# Patient Record
Sex: Male | Born: 1990 | Race: Black or African American | Hispanic: Yes | Marital: Single | Smoking: Current some day smoker
Health system: Southern US, Community
[De-identification: ages and names within clinical notes are randomized; demographics above are authoritative.]

## PROBLEM LIST (undated history)

## (undated) HISTORY — PX: HAND SURGERY: SHX662

---
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
## 2010-11-12 ENCOUNTER — Emergency Department (HOSPITAL_COMMUNITY)
Admission: EM | Admit: 2010-11-12 | Discharge: 2010-11-12 | Disposition: A | Discharge status: Home / Self Care | Payer: 59 | Attending: Emergency Medicine | Admitting: Emergency Medicine

## 2010-11-12 ENCOUNTER — Emergency Department (HOSPITAL_COMMUNITY): Payer: 59

## 2010-11-12 DIAGNOSIS — M25579 Pain in unspecified ankle and joints of unspecified foot: Secondary | ICD-10-CM | POA: Insufficient documentation

## 2010-11-12 DIAGNOSIS — W219XXA Striking against or struck by unspecified sports equipment, initial encounter: Secondary | ICD-10-CM | POA: Insufficient documentation

## 2010-11-12 DIAGNOSIS — S93609A Unspecified sprain of unspecified foot, initial encounter: Secondary | ICD-10-CM | POA: Insufficient documentation

## 2010-11-12 DIAGNOSIS — S93409A Sprain of unspecified ligament of unspecified ankle, initial encounter: Secondary | ICD-10-CM | POA: Insufficient documentation

## 2010-11-12 DIAGNOSIS — Y9366 Activity, soccer: Secondary | ICD-10-CM | POA: Insufficient documentation

## 2011-12-19 ENCOUNTER — Encounter (HOSPITAL_COMMUNITY): Payer: Self-pay | Admitting: Emergency Medicine

## 2011-12-19 ENCOUNTER — Emergency Department (HOSPITAL_COMMUNITY)
Admission: EM | Admit: 2011-12-19 | Discharge: 2011-12-19 | Disposition: A | Discharge status: Home / Self Care | Payer: Managed Care, Other (non HMO) | Attending: Emergency Medicine | Admitting: Emergency Medicine

## 2011-12-19 DIAGNOSIS — W219XXA Striking against or struck by unspecified sports equipment, initial encounter: Secondary | ICD-10-CM | POA: Insufficient documentation

## 2011-12-19 DIAGNOSIS — M79609 Pain in unspecified limb: Secondary | ICD-10-CM

## 2011-12-19 DIAGNOSIS — S8010XA Contusion of unspecified lower leg, initial encounter: Secondary | ICD-10-CM | POA: Insufficient documentation

## 2011-12-19 DIAGNOSIS — Y9239 Other specified sports and athletic area as the place of occurrence of the external cause: Secondary | ICD-10-CM | POA: Insufficient documentation

## 2011-12-19 DIAGNOSIS — Y9366 Activity, soccer: Secondary | ICD-10-CM | POA: Insufficient documentation

## 2011-12-19 DIAGNOSIS — R209 Unspecified disturbances of skin sensation: Secondary | ICD-10-CM | POA: Insufficient documentation

## 2011-12-19 DIAGNOSIS — M7989 Other specified soft tissue disorders: Secondary | ICD-10-CM

## 2011-12-19 MED ORDER — TRAMADOL HCL 50 MG PO TABS: 50.0000 mg | ORAL_TABLET | Freq: Four times a day (QID) | ORAL | Status: AC | PRN

## 2011-12-19 NOTE — ED Provider Notes (Signed)
History     CSN: [id number]  Arrival date & time 12/19/11  1341   First MD Initiated Contact with Patient 12/19/11 1502     3:38 PM HPI Patient reports he is here for left calf pain. States he was playing soccer when he was kicked in his calf and has had progressively worsening hematoma and pain of his posterior calf. States pain radiates down to her foot. States he will also sometimes cause numbness in the foot. Denies chest pain, shortness of breath, deformity. Reports able to walk but is painful.  Patient is a 21 y.o. male presenting with leg pain. The history is provided by the patient.  Leg Pain  Incident location: kicked while playing soccer. The injury mechanism was a direct blow (kicked). Pain location: left calf. The pain is moderate. The pain has been constant since onset. Associated symptoms include numbness and tingling. Pertinent negatives include no inability to bear weight, no loss of motion, no muscle weakness and no loss of sensation. He reports no foreign bodies present. Exacerbated by: walking. He has tried NSAIDs for the symptoms. The treatment provided mild relief.    History reviewed. No pertinent past medical history.  History reviewed. No pertinent past surgical history.  No family history on file.  History  Substance Use Topics  . Smoking status: Current Some Day Smoker  . Smokeless tobacco: Not on file  . Alcohol Use: No      Review of Systems  Constitutional: Negative for fever, chills and diaphoresis.  Respiratory: Negative for shortness of breath.   Cardiovascular: Negative for chest pain.  Musculoskeletal: Negative for back pain.       Calf pain and contusion  Neurological: Positive for tingling and numbness. Negative for dizziness and weakness.  All other systems reviewed and are negative.    Allergies  Mushroom extract complex  Home Medications   Current Outpatient Rx  Name Route Sig Dispense Refill  . IBUPROFEN 200 MG PO TABS Oral Take  200 mg by mouth every 6 (six) hours as needed. Pain      BP 109/60  Pulse 67  Temp(Src) 98.8 F (37.1 C) (Oral)  Resp 20  SpO2 100%  Physical Exam  Constitutional: He appears well-developed and well-nourished.  HENT:  Head: Normocephalic and atraumatic.  Eyes: Pupils are equal, round, and reactive to light.  Cardiovascular: Normal rate and regular rhythm.   Pulmonary/Chest: Effort normal and breath sounds normal.  Musculoskeletal:       Left lower leg: He exhibits tenderness and swelling. He exhibits no bony tenderness, no deformity and no laceration.       Legs: Skin: Skin is warm and dry. No rash noted. No erythema. No pallor.  Psychiatric: He has a normal mood and affect. His behavior is normal.    ED Course  Procedures   MDM   Discussed and we will order ultrasound to rule out DVT for patient. The suspicion. Suspect patient has a contusion.   4:24 PM Venous Dopplers were negative for DVT. Will discharge home with Ultram and continue to use ibuprofen for pain if needed. Patient agrees with plan and is ready for discharge.     Thomasene Lot, PA-C 12/19/11 1624

## 2011-12-19 NOTE — Discharge Instructions (Signed)
Your ultrasound was normal for a deep venous thrombosis, also known as a blood clot. Likely you are experiencing pain from a large contusion. Please read instructions below regarding care for this.  Contusion A contusion is a deep bruise. Contusions are the result of an injury that caused bleeding under the skin. The contusion may turn blue, purple, or yellow. Minor injuries will give you a painless contusion, but more severe contusions may stay painful and swollen for a few weeks.  CAUSES  A contusion is usually caused by a blow, trauma, or direct force to an area of the body. SYMPTOMS   Swelling and redness of the injured area.   Bruising of the injured area.   Tenderness and soreness of the injured area.   Pain.  DIAGNOSIS  The diagnosis can be made by taking a history and physical exam. An X-ray, CT scan, or MRI may be needed to determine if there were any associated injuries, such as fractures. TREATMENT  Specific treatment will depend on what area of the body was injured. In general, the best treatment for a contusion is resting, icing, elevating, and applying cold compresses to the injured area. Over-the-counter medicines may also be recommended for pain control. Ask your caregiver what the best treatment is for your contusion. HOME CARE INSTRUCTIONS   Put ice on the injured area.   Put ice in a plastic bag.   Place a towel between your skin and the bag.   Leave the ice on for 15 to 20 minutes, 3 to 4 times a day.   Only take over-the-counter or prescription medicines for pain, discomfort, or fever as directed by your caregiver. Your caregiver may recommend avoiding anti-inflammatory medicines (aspirin, ibuprofen, and naproxen) for 48 hours because these medicines may increase bruising.   Rest the injured area.   If possible, elevate the injured area to reduce swelling.  SEEK IMMEDIATE MEDICAL CARE IF:   You have increased bruising or swelling.   You have pain that is  getting worse.   Your swelling or pain is not relieved with medicines.  MAKE SURE YOU:   Understand these instructions.   Will watch your condition.   Will get help right away if you are not doing well or get worse.  Document Released: 05/23/2005 Document Revised: 08/02/2011 Document Reviewed: 06/18/2011 [facility] Patient Information 2012 [place], [place].

## 2011-12-19 NOTE — ED Provider Notes (Signed)
Medical screening examination/treatment/procedure(s) were performed by non-physician practitioner and as supervising physician I was immediately available for consultation/collaboration.   Rolan Bucco, MD 12/19/11 1723

## 2011-12-19 NOTE — Progress Notes (Signed)
VASCULAR LAB PRELIMINARY  PRELIMINARY  PRELIMINARY  PRELIMINARY Left lower extremity venous duplex completed.    Preliminary report:  Left:  No evidence of DVT, superficial thrombosis, or Baker's cyst.  Annielee Jemmott D, 12/19/2011, 4:21 PM

## 2011-12-19 NOTE — ED Notes (Signed)
Pt presenting to ed with c/o left leg with pain s/p getting kicked in his left calf with hematoma that's increasing in size and worse with walking.

## 2012-02-24 IMAGING — CR DG ANKLE COMPLETE 3+V*L*
3 series · 3 of 3 positions shown · non-contrast
Comparison: None.

CLINICAL DATA: Twisted left ankle.  Left ankle pain.

LEFT ANKLE COMPLETE - 3+ VIEW

[t ankle joint ap left]
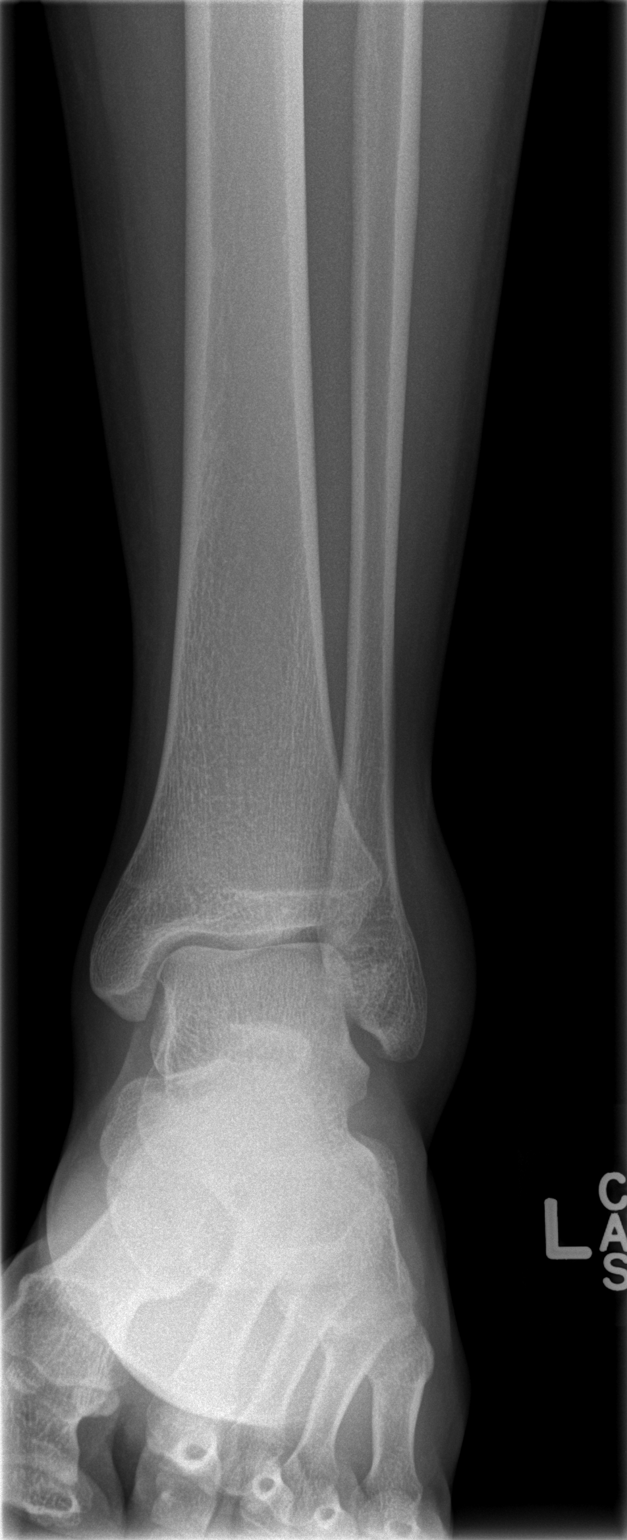

[t ankle joint oblique left]
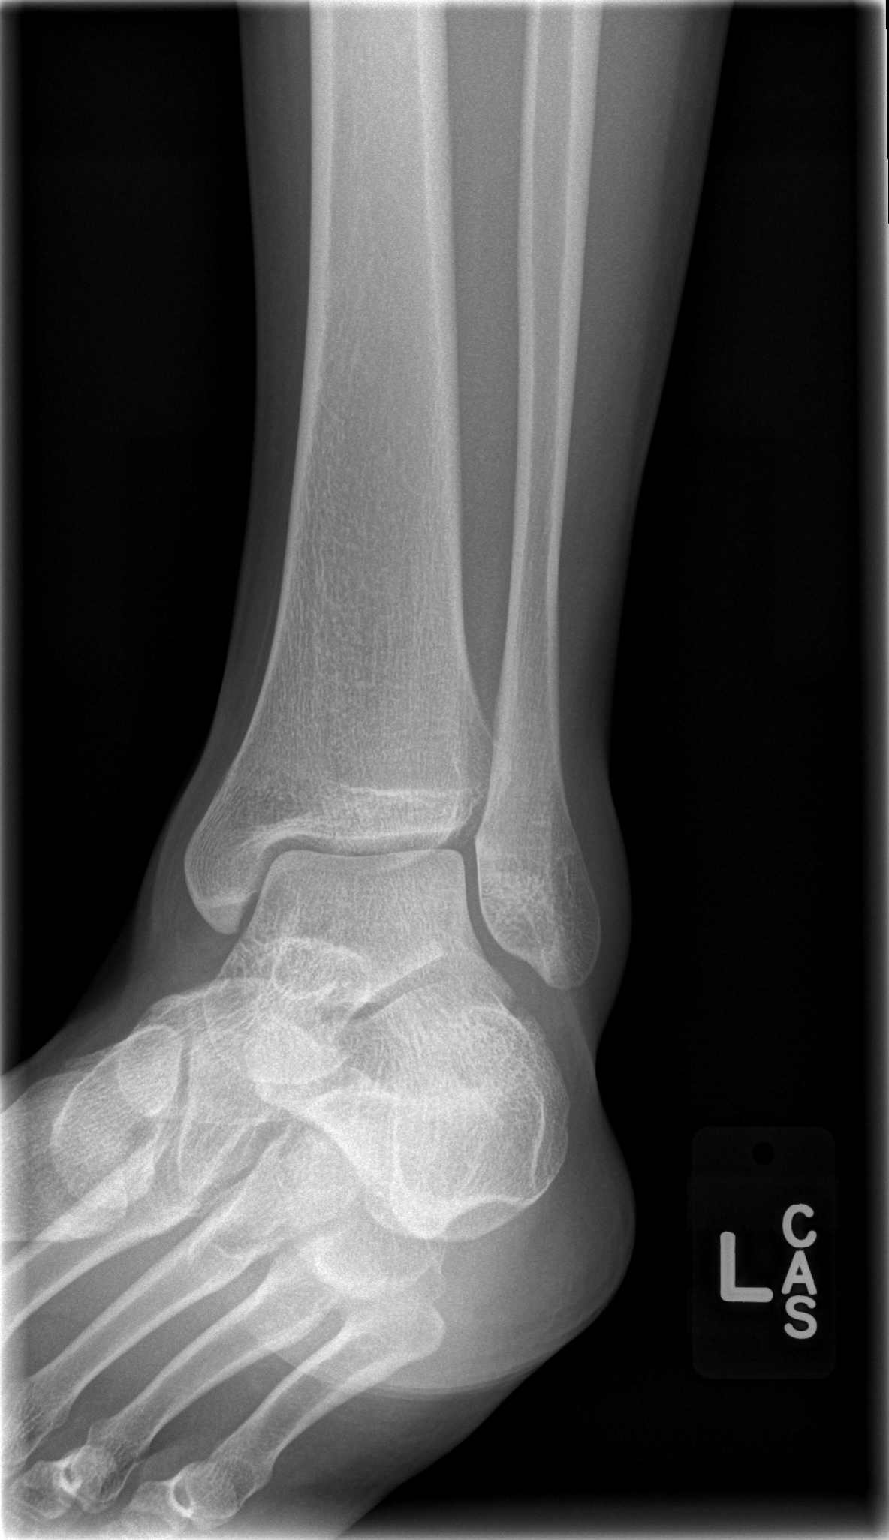

[t ankle joint lat left]
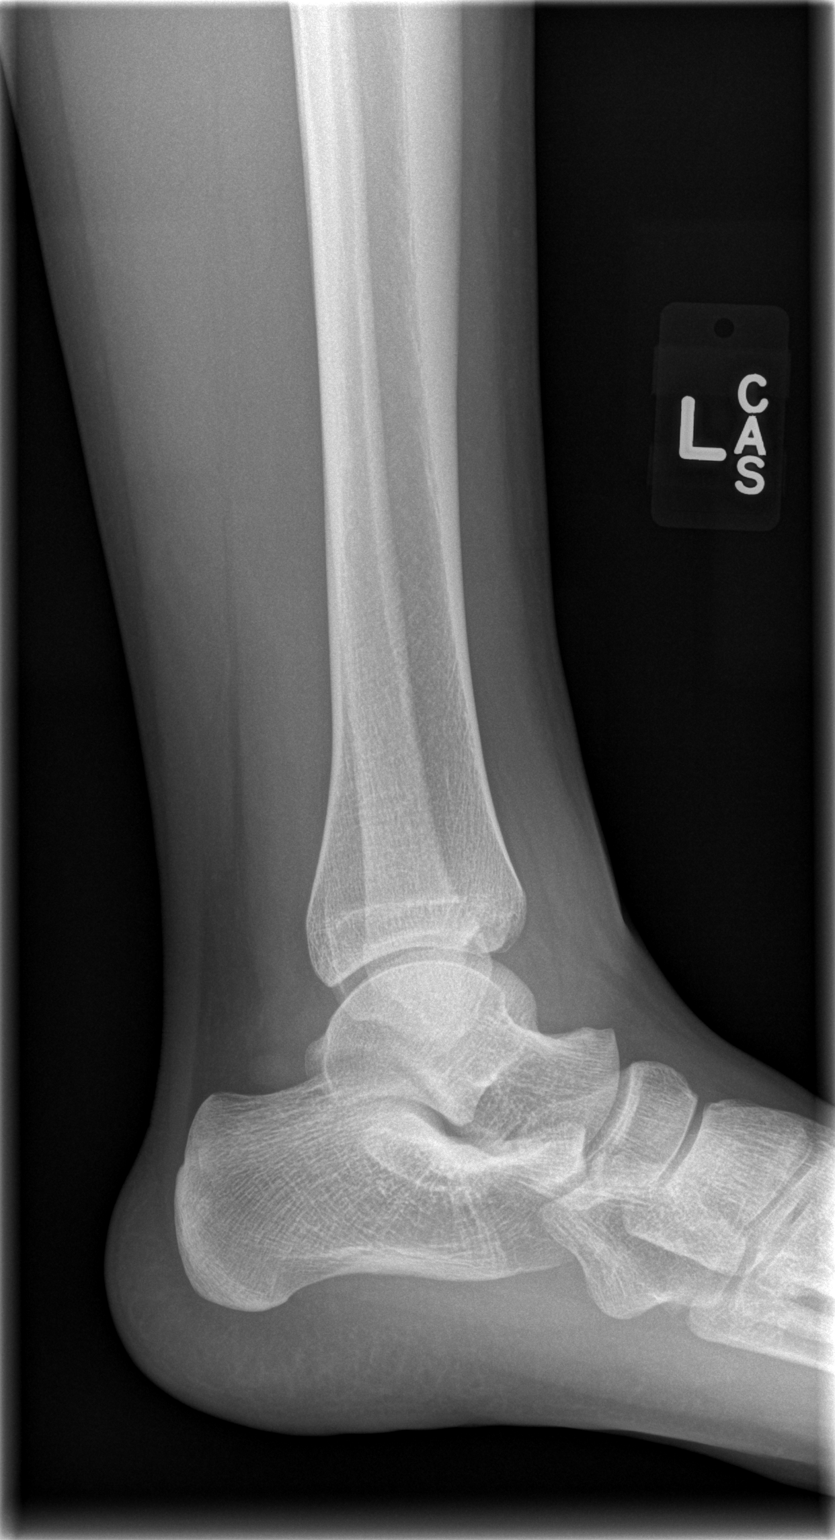

[3 of 3 positions shown; findings below may reference images not displayed]

FINDINGS: The ankle mortise is congruent.  The talar dome is
intact.  No ankle effusion is present.  Lateral malleolar soft
tissue swelling.
IMPRESSION: No acute osseous abnormality.

## 2012-04-14 ENCOUNTER — Encounter (HOSPITAL_COMMUNITY): Payer: Self-pay | Admitting: *Deleted

## 2012-04-14 ENCOUNTER — Emergency Department (HOSPITAL_COMMUNITY)
Admission: EM | Admit: 2012-04-14 | Discharge: 2012-04-14 | Disposition: A | Discharge status: Home / Self Care | Payer: Managed Care, Other (non HMO) | Attending: Emergency Medicine | Admitting: Emergency Medicine

## 2012-04-14 ENCOUNTER — Emergency Department (HOSPITAL_COMMUNITY): Payer: Managed Care, Other (non HMO)

## 2012-04-14 DIAGNOSIS — F17200 Nicotine dependence, unspecified, uncomplicated: Secondary | ICD-10-CM | POA: Insufficient documentation

## 2012-04-14 DIAGNOSIS — J209 Acute bronchitis, unspecified: Secondary | ICD-10-CM | POA: Insufficient documentation

## 2012-04-14 LAB — RAPID STREP SCREEN (MED CTR MEBANE ONLY): Streptococcus, Group A Screen (Direct): NEGATIVE

## 2012-04-14 MED ORDER — GUAIFENESIN 100 MG/5ML PO LIQD: 100.0000 mg | ORAL | Status: AC | PRN

## 2012-04-14 MED ORDER — ALBUTEROL SULFATE HFA 108 (90 BASE) MCG/ACT IN AERS: 1.0000 | INHALATION_SPRAY | Freq: Four times a day (QID) | RESPIRATORY_TRACT | Status: DC | PRN

## 2012-04-14 NOTE — ED Notes (Signed)
Pt c/o cough and sore throat x's 3 days.

## 2012-04-14 NOTE — ED Provider Notes (Signed)
History     CSN: [id number]  Arrival date & time 04/14/12  1531   None     Chief Complaint  Patient presents with  . Sore Throat  . Cough    (Consider location/radiation/quality/duration/timing/severity/associated sxs/prior treatment) HPI Comments: Patient reports a 4 day history of sore throat and cough. He reports gradual onset. He says the cough is unproductive and persistent and not better or worse at any point in the day. He has associated chest pain with coughing. He denies fever, sinus congestion, wheezing. He has tried tea with honey which has not helped. He denies sick contacts.   Patient is a 21 y.o. male presenting with pharyngitis and cough.  Sore Throat Associated symptoms include chest pain and coughing. Pertinent negatives include no chills, congestion, diaphoresis, fatigue, fever, headaches, myalgias, nausea, neck pain, rash, vomiting or weakness.  Cough Associated symptoms include chest pain. Pertinent negatives include no chills, no headaches, no myalgias, no shortness of breath and no wheezing.    History reviewed. No pertinent past medical history.  History reviewed. No pertinent past surgical history.  History reviewed. No pertinent family history.  History  Substance Use Topics  . Smoking status: Current Some Day Smoker    Types: Cigars  . Smokeless tobacco: Not on file  . Alcohol Use: No      Review of Systems  Constitutional: Negative for fever, chills, diaphoresis and fatigue.  HENT: Negative for congestion, neck pain and postnasal drip.   Respiratory: Positive for cough. Negative for shortness of breath and wheezing.   Cardiovascular: Positive for chest pain.  Gastrointestinal: Negative for nausea, vomiting and diarrhea.  Musculoskeletal: Negative for myalgias.  Skin: Negative for rash and wound.  Neurological: Negative for dizziness, weakness and headaches.    Allergies  Mushroom extract complex  Home Medications   Current Outpatient  Rx  Name Route Sig Dispense Refill  . IBUPROFEN 200 MG PO TABS Oral Take 200 mg by mouth every 6 (six) hours as needed. Pain.    Marland Kitchen OVER THE COUNTER MEDICATION Oral Take 1 tablet by mouth daily as needed. Cough.    Marland Kitchen OVER THE COUNTER MEDICATION Oral Take 15 mLs by mouth 2 (two) times daily as needed. "Cold and flu liquid."      BP 106/84  Pulse 82  Temp 98.9 F (37.2 C) (Oral)  Resp 18  Wt 132 lb 3.2 oz (59.966 kg)  SpO2 100%  Physical Exam  Nursing note and vitals reviewed. Constitutional: He is oriented to person, place, and time. He appears well-developed and well-nourished. No distress.  HENT:  Head: Normocephalic and atraumatic.  Mouth/Throat: No oropharyngeal exudate.       Pharyngeal erythema noted.   Eyes: Conjunctivae are normal. No scleral icterus.  Neck: Normal range of motion.  Cardiovascular: Normal rate and regular rhythm.  Exam reveals no gallop and no friction rub.   No murmur heard. Pulmonary/Chest: Effort normal and breath sounds normal.       Mild rales heard throughout lung fields.   Abdominal: Soft. There is no tenderness.  Musculoskeletal: Normal range of motion.  Neurological: He is alert and oriented to person, place, and time.  Skin: Skin is warm and dry. He is not diaphoretic.  Psychiatric: He has a normal mood and affect. His behavior is normal.    ED Course  Procedures (including critical care time)   Labs Reviewed  RAPID STREP SCREEN   Dg Chest 2 View  04/14/2012  *RADIOLOGY REPORT*  Clinical Data:  Sore throat  CHEST - 2 VIEW  Comparison: None.  Findings: Bronchitic changes.  Hyperaeration.  No consolidation. No pneumothorax.  No pleural effusion.  IMPRESSION: Bronchitic changes and hyperaeration.   Original Report Authenticated By: Donavan Burnet, M.D.      No diagnosis found.    MDM  6:00 PM Patient's xray shows bronchitic changes. He will be treated as acute bronchitis with albuterol inhaler and antitussives. He should return if  cough persists beyond 4 weeks. No further evaluation at this time.         Emilia Beck, PA-C 04/14/12 1801

## 2012-04-16 NOTE — ED Provider Notes (Signed)
Medical screening examination/treatment/procedure(s) were performed by non-physician practitioner and as supervising physician I was immediately available for consultation/collaboration.  Raeford Razor, MD 04/16/12 [phone]

## 2012-08-10 ENCOUNTER — Emergency Department (HOSPITAL_BASED_OUTPATIENT_CLINIC_OR_DEPARTMENT_OTHER)
Admission: EM | Admit: 2012-08-10 | Discharge: 2012-08-11 | Disposition: A | Discharge status: Home / Self Care | Payer: Managed Care, Other (non HMO) | Attending: Emergency Medicine | Admitting: Emergency Medicine

## 2012-08-10 ENCOUNTER — Emergency Department (HOSPITAL_BASED_OUTPATIENT_CLINIC_OR_DEPARTMENT_OTHER): Payer: Managed Care, Other (non HMO)

## 2012-08-10 ENCOUNTER — Encounter (HOSPITAL_BASED_OUTPATIENT_CLINIC_OR_DEPARTMENT_OTHER): Payer: Self-pay | Admitting: *Deleted

## 2012-08-10 DIAGNOSIS — F17200 Nicotine dependence, unspecified, uncomplicated: Secondary | ICD-10-CM | POA: Insufficient documentation

## 2012-08-10 DIAGNOSIS — Y939 Activity, unspecified: Secondary | ICD-10-CM | POA: Insufficient documentation

## 2012-08-10 DIAGNOSIS — W230XXA Caught, crushed, jammed, or pinched between moving objects, initial encounter: Secondary | ICD-10-CM | POA: Insufficient documentation

## 2012-08-10 DIAGNOSIS — Y92009 Unspecified place in unspecified non-institutional (private) residence as the place of occurrence of the external cause: Secondary | ICD-10-CM | POA: Insufficient documentation

## 2012-08-10 DIAGNOSIS — Z79899 Other long term (current) drug therapy: Secondary | ICD-10-CM | POA: Insufficient documentation

## 2012-08-10 DIAGNOSIS — S6000XA Contusion of unspecified finger without damage to nail, initial encounter: Secondary | ICD-10-CM | POA: Insufficient documentation

## 2012-08-10 DIAGNOSIS — S60111A Contusion of right thumb with damage to nail, initial encounter: Secondary | ICD-10-CM

## 2012-08-10 MED ORDER — IBUPROFEN 800 MG PO TABS
800.0000 mg | ORAL_TABLET | Freq: Once | ORAL | Status: AC
  Administered 2012-08-11: 800 mg via ORAL
  Filled 2012-08-10: qty 1

## 2012-08-10 NOTE — ED Notes (Signed)
Pt. States that he "smashed" his right thumb in his car door approx 3 weeks ago. C/o pain to left thumb and brusing noted under nailbed with swelling. No other complaints.

## 2012-08-10 NOTE — ED Provider Notes (Signed)
History  This chart was scribed for Ryan Mcclain Ryan Cords, MD by Shari Heritage, ED Scribe. The patient was seen in room MHT13/MHT13. Patient's care was started at 2325.  CSN: [id number]  Arrival date & time 08/10/12  1945   First MD Initiated Contact with Patient 08/10/12 2325      Chief Complaint  Patient presents with  . right thumb swelling      Patient is a 21 y.o. male presenting with hand injury. The history is provided by the patient. No language interpreter was used.  Hand Injury  The incident occurred more than 1 week ago. The incident occurred at home. The injury mechanism was a direct blow. The pain is present in the right hand. The pain is moderate. The pain has been constant since the incident. He reports no foreign bodies present. The symptoms are aggravated by palpation. He has tried ice for the symptoms.    HPI Comments: Ryan Mcclain is a 21 y.o. male who presents to the Emergency Department complaining of moderate to severe, constant, non-radiating, dull pain in the right thumb with associated swelling and bruising underneath the nail bed. Patient states that he slammed his right thumb in his car door three weeks ago. Patient says that he iced his thumb immediately after the injury and noted some bruising and minimal pain immediately after the incident. Patient states that three days ago, pain and swelling increased significantly. Patient hasn't been taking any NSAIDs or other pain medications for relief. Patient denies any other symptoms at this time. No other injuries. He reports no significant past medical or surgical history. He is a current some day smoker.    No family history on file.  History  Substance Use Topics  . Smoking status: Current Some Day Smoker    Types: Cigars  . Smokeless tobacco: Not on file  . Alcohol Use: No      Review of Systems  Musculoskeletal: Positive for arthralgias.       Positive for right hand pain (thumb).  All other systems  reviewed and are negative.    Allergies  Mushroom extract complex  Home Medications   Current Outpatient Rx  Name  Route  Sig  Dispense  Refill  . ALBUTEROL SULFATE HFA 108 (90 BASE) MCG/ACT IN AERS   Inhalation   Inhale 1-2 puffs into the lungs every 6 (six) hours as needed for wheezing.   1 Inhaler   0   . IBUPROFEN 200 MG PO TABS   Oral   Take 200 mg by mouth every 6 (six) hours as needed. Pain.         Marland Kitchen OVER THE COUNTER MEDICATION   Oral   Take 1 tablet by mouth daily as needed. Cough.         Marland Kitchen OVER THE COUNTER MEDICATION   Oral   Take 15 mLs by mouth 2 (two) times daily as needed. "Cold and flu liquid."           Triage Vitals: BP 114/68  Pulse 77  Temp 98.3 F (36.8 C)  Resp 18  Ht 5\' 10"  (1.778 m)  Wt 130 lb (58.968 kg)  BMI 18.65 kg/m2  SpO2 100%  Physical Exam  Nursing note and vitals reviewed. Constitutional: He is oriented to person, place, and time. He appears well-developed and well-nourished. No distress.  HENT:  Head: Normocephalic and atraumatic.  Mouth/Throat: Oropharynx is clear and moist and mucous membranes are normal.  Eyes: Conjunctivae normal and EOM are  normal. Pupils are equal, round, and reactive to light.  Neck: Normal range of motion. Neck supple.  Cardiovascular: Normal rate and regular rhythm.   No murmur heard. Pulmonary/Chest: Effort normal and breath sounds normal. No respiratory distress. He has no wheezes. He has no rales.  Abdominal: Soft. Bowel sounds are normal. He exhibits no distension. There is no tenderness.  Musculoskeletal: Normal range of motion. He exhibits no edema.       No bony deformity of right thumb. Full ROM. Neurovascularly intact. No snuff box. Subungual hematoma underneath nail of right thumb, about 1/4 of the nail in size. No signs of paronychia.   Neurological: He is alert and oriented to person, place, and time.  Skin: Skin is warm and dry.  Psychiatric: He has a normal mood and affect. His  behavior is normal.    ED Course  INCISION AND DRAINAGE Date/Time: 08/11/2012 12:49 AM Performed by: Jasmine Awe Authorized by: Jasmine Awe Consent: Verbal consent obtained. Patient identity confirmed: arm band Type: seroma (cuticle) Body area: upper extremity Location details: right thumb Patient sedated: no Scalpel size: 11 Incision type: single straight Complexity: simple Drainage: serosanguinous Drainage amount: scant Wound treatment: wound left open Patient tolerance: Patient tolerated the procedure well with no immediate complications.   patient had swelling along cuticle, lifted cuticl serosanguinous drainage expressed area deflated, patient feels improved  (including critical care time) DIAGNOSTIC STUDIES: Oxygen Saturation is 100% on room air, normal by my interpretation.    COORDINATION OF CARE: 11:54 PM- Patient informed of current plan for treatment and evaluation and agrees with plan at this time.      Dg Hand Complete Right  08/11/2012  *RADIOLOGY REPORT*  Clinical Data: Hand pain  RIGHT HAND - COMPLETE 3+ VIEW  Comparison: None.  Findings: No fracture or dislocation.  No aggressive osseous lesions.  IMPRESSION: No acute osseous abnormality of the right hand.   Original Report Authenticated By: Jearld Lesch, M.D.      No diagnosis found.    MDM  Subungual hematoma lower 1/4 of the nail has been there for three weeks so unable to trephinate the nail to relieve pain, white in the center. Will place on antibiotics. Follow up with hand surgeon this week for continuing care. Return for worsening pain, swelling, fever, numbness, weakness, diarrhea, vomiting or any concerns. Patient verbalizes understanding and agrees to follow up.     I personally performed the services described in this documentation, which was scribed in my presence. The recorded information has been reviewed and is accurate.     Jasmine Awe,  MD 08/11/12 [phone]

## 2012-08-11 ENCOUNTER — Encounter (HOSPITAL_COMMUNITY): Payer: Self-pay | Admitting: *Deleted

## 2012-08-11 ENCOUNTER — Emergency Department (HOSPITAL_COMMUNITY)
Admission: EM | Admit: 2012-08-11 | Discharge: 2012-08-11 | Disposition: A | Discharge status: Home / Self Care | Payer: Managed Care, Other (non HMO) | Attending: Emergency Medicine | Admitting: Emergency Medicine

## 2012-08-11 DIAGNOSIS — Y9389 Activity, other specified: Secondary | ICD-10-CM | POA: Insufficient documentation

## 2012-08-11 DIAGNOSIS — F17200 Nicotine dependence, unspecified, uncomplicated: Secondary | ICD-10-CM | POA: Insufficient documentation

## 2012-08-11 DIAGNOSIS — Y9210 Unspecified residential institution as the place of occurrence of the external cause: Secondary | ICD-10-CM | POA: Insufficient documentation

## 2012-08-11 DIAGNOSIS — W07XXXA Fall from chair, initial encounter: Secondary | ICD-10-CM | POA: Insufficient documentation

## 2012-08-11 DIAGNOSIS — R55 Syncope and collapse: Secondary | ICD-10-CM | POA: Insufficient documentation

## 2012-08-11 DIAGNOSIS — R9431 Abnormal electrocardiogram [ECG] [EKG]: Secondary | ICD-10-CM | POA: Insufficient documentation

## 2012-08-11 LAB — URINALYSIS, ROUTINE W REFLEX MICROSCOPIC
Bilirubin Urine: NEGATIVE
Glucose, UA: NEGATIVE mg/dL
Ketones, ur: NEGATIVE mg/dL
Nitrite: NEGATIVE
Protein, ur: NEGATIVE mg/dL
pH: 7.5 (ref 5.0–8.0)

## 2012-08-11 LAB — BASIC METABOLIC PANEL
BUN: 12 mg/dL (ref 6–23)
Calcium: 9.6 mg/dL (ref 8.4–10.5)
Chloride: 99 mEq/L (ref 96–112)
Creatinine, Ser: 0.79 mg/dL (ref 0.50–1.35)
GFR calc Af Amer: >90 mL/min (ref >90)

## 2012-08-11 LAB — CBC WITH DIFFERENTIAL/PLATELET
Basophils Relative: 0 % (ref 0–1)
Eosinophils Relative: 3 % (ref 0–5)
HCT: 49.3 % (ref 39.0–52.0)
Hemoglobin: 17.4 g/dL — ABNORMAL HIGH (ref 13.0–17.0)
MCH: 31.2 pg (ref 26.0–34.0)
MCHC: 35.3 g/dL (ref 30.0–36.0)
MCV: 88.4 fL (ref 78.0–100.0)
Monocytes Absolute: 0.6 10*3/uL (ref 0.1–1.0)
Monocytes Relative: 11 % (ref 3–12)
Neutro Abs: 2.9 10*3/uL (ref 1.7–7.7)
RDW: 12.5 % (ref 11.5–15.5)

## 2012-08-11 MED ORDER — SULFAMETHOXAZOLE-TRIMETHOPRIM 800-160 MG PO TABS: 1.0000 | ORAL_TABLET | Freq: Two times a day (BID) | ORAL | Status: DC

## 2012-08-11 MED ORDER — SODIUM CHLORIDE 0.9 % IV BOLUS (SEPSIS)
1000.0000 mL | Freq: Once | INTRAVENOUS | Status: AC
  Administered 2012-08-11: 1000 mL via INTRAVENOUS

## 2012-08-11 MED ORDER — IBUPROFEN 600 MG PO TABS: 600.0000 mg | ORAL_TABLET | Freq: Four times a day (QID) | ORAL | Status: DC | PRN

## 2012-08-11 NOTE — ED Provider Notes (Addendum)
History     CSN: [id number]  Arrival date & time 08/11/12  1814   First MD Initiated Contact with Patient 08/11/12 1818      Chief Complaint  Patient presents with  . Loss of Consciousness    (Consider location/radiation/quality/duration/timing/severity/associated sxs/prior treatment) HPI 21 y/o male presents from Hand surgery center with cc syncopal episode. Patient states that he had not eaten all day before the procedure.  He was given digital block and became light headed. He was allowed to lay down and given juice and crackers.  The patient was got through the procedure and was recovering in a chair when he lost consciousness and slumped to the floor.  Patient had 1 min LOC and apparently hit his head on the wall. Transported via EMS on spinal precautions.  Normal CBG.  Patient has history of previous syncopal episode while at home in his country of [place].  He denies been evaluated at the hospital for visit the episode at that time.  He states that he had also gone a long period of time without eating when this occurred.  denies racing or skipping heart denies recent history of nausea vomiting or fluid loss.   Denies fevers, chills, myalgias, arthralgias. Denies DOE, SOB, chest tightness or pressure, radiation to left arm, jaw or back, or diaphoresis. Denies dysuria, flank pain, suprapubic pain, frequency, urgency, or hematuria. Denies headaches, light headedness, weakness, visual disturbances. Denies abdominal pain, nausea, vomiting, diarrhea or constipation.    History reviewed. No pertinent past medical history.  Past Surgical History  Procedure Date  . Hand surgery     History reviewed. No pertinent family history.  History  Substance Use Topics  . Smoking status: Current Some Day Smoker    Types: Pipe  . Smokeless tobacco: Not on file  . Alcohol Use: 0.5 oz/week    1 drink(s) per week      Review of Systems Ten systems reviewed and are negative for  acute change, except as noted in the HPI. \ Allergies  Mushroom extract complex  Home Medications  No current outpatient prescriptions on file.  BP 123/66  Pulse 67  Temp 97.5 F (36.4 C) (Oral)  Resp 12  SpO2 98%  Physical Exam Physical Exam  Nursing note and vitals reviewed. Constitutional: He appears well-developed and well-nourished. No distress.  HENT:  Head: Normocephalic and atraumatic.  Eyes: Conjunctivae normal are normal. No scleral icterus.  Neck: Normal range of motion. Neck supple.  Cardiovascular: Normal rate, regular rhythm and normal heart sounds.   Pulmonary/Chest: Effort normal and breath sounds normal. No respiratory distress.  Abdominal: Soft. There is no tenderness.  Musculoskeletal: He exhibits no edema.  Neurological: He is alert.  Skin: Skin is warm and dry. He is not diaphoretic.  Psychiatric: His behavior is normal.    ED Course  Procedures (including critical care time)  Labs Reviewed  CBC WITH DIFFERENTIAL - Abnormal; Notable for the following:    Hemoglobin 17.4 (*)     All other components within normal limits  BASIC METABOLIC PANEL  URINALYSIS, ROUTINE W REFLEX MICROSCOPIC   Dg Hand Complete Right  08/11/2012  *RADIOLOGY REPORT*  Clinical Data: Hand pain  RIGHT HAND - COMPLETE 3+ VIEW  Comparison: None.  Findings: No fracture or dislocation.  No aggressive osseous lesions.  IMPRESSION: No acute osseous abnormality of the right hand.   Original Report Authenticated By: Jearld Lesch, M.D.      Date: 08/12/2012  Rate: 77  Rhythm: normal sinus rhythm  QRS Axis: normal  Intervals: normal  ST/T Wave abnormalities: normal  Conduction Disutrbances: none  Narrative Interpretation:  Lateral strain pattern Benign early repole  Old EKG Reviewed:NA    1. Syncope   2. Abnormal ECG       MDM   Filed Vitals:   08/11/12 1807 08/11/12 1817  BP:  123/66  Pulse:  67  Temp:  97.5 F (36.4 C)  TempSrc:  Oral  Resp:  12  SpO2: 100%  98%    Patient cleared with Nexus criteria. Head exam is atraumatic and normal neuro exam I will obtain basic labs to r/o heart arrythmia/ electrolyte distrubance.  Patient has elevated hgb which may reflect dehydration. i am awaiting lab results.   Patient has high hgb.  States he smokes hookah about 3x week. May represent polycythemia vs. Some dehydration.  The patient has gotten a bag of Fluids. He is and is feeling better.  The patient has had no sxs while here in the ED.  He is asking to leave. He will f/u with cardiology regarding his EKG and syncope. Pt. Given driving restrictions. Discussed reasons to seek immediate care. Patient expresses understanding and agrees with plan.        Arthor Captain, PA-C 08/12/12 1720  Arthor Captain, PA-C 09/06/12 1520

## 2012-08-11 NOTE — ED Notes (Signed)
AIDET performed. 

## 2012-08-11 NOTE — ED Notes (Signed)
MD at bedside. 

## 2012-08-11 NOTE — ED Notes (Signed)
Pt in C collar & LSB upon arrival to ED

## 2012-08-11 NOTE — ED Notes (Signed)
Pt in from [facility] via [facility], pt received Norocane during procedure to R thumb nail, pt reports feeling light headed during first injection & post procedure, after procedure the pt was sitting in a chair & fell from the chair hitting his head on the wall & the floor with witnessed LOC x 1 min, pt GCS 15, A&O x4, follows commands, speaks in complete sentences upon arrival EMS arrival & upon arrival to ED, pt CBG 80 per EMS, pt given OJ & crackers prior to procedure, pt reports having nothing to eat since breakfast before the snack before the procedure

## 2012-08-11 NOTE — ED Notes (Signed)
Pt removed from LSB, pt denies back pain, pt remains in C collar

## 2012-08-12 NOTE — ED Provider Notes (Signed)
Medical screening examination/treatment/procedure(s) were performed by non-physician practitioner and as supervising physician I was immediately available for consultation/collaboration.   Gerhard Munch, MD 08/12/12 [phone]

## 2012-09-06 NOTE — ED Provider Notes (Signed)
Please see my initial note regarding this supervised patient encounter.  Gerhard Munch, MD 09/06/12 [phone]

## 2013-09-06 ENCOUNTER — Emergency Department (HOSPITAL_COMMUNITY): Payer: Managed Care, Other (non HMO)

## 2013-09-06 ENCOUNTER — Emergency Department (HOSPITAL_COMMUNITY)
Admission: EM | Admit: 2013-09-06 | Discharge: 2013-09-07 | Disposition: A | Discharge status: Home / Self Care | Payer: Managed Care, Other (non HMO) | Attending: Emergency Medicine | Admitting: Emergency Medicine

## 2013-09-06 ENCOUNTER — Encounter (HOSPITAL_COMMUNITY): Payer: Self-pay | Admitting: Emergency Medicine

## 2013-09-06 DIAGNOSIS — R0602 Shortness of breath: Secondary | ICD-10-CM | POA: Insufficient documentation

## 2013-09-06 DIAGNOSIS — R000 Tachycardia, unspecified: Secondary | ICD-10-CM | POA: Insufficient documentation

## 2013-09-06 DIAGNOSIS — IMO0001 Reserved for inherently not codable concepts without codable children: Secondary | ICD-10-CM | POA: Insufficient documentation

## 2013-09-06 DIAGNOSIS — R69 Illness, unspecified: Secondary | ICD-10-CM

## 2013-09-06 DIAGNOSIS — J111 Influenza due to unidentified influenza virus with other respiratory manifestations: Secondary | ICD-10-CM | POA: Insufficient documentation

## 2013-09-06 DIAGNOSIS — F17200 Nicotine dependence, unspecified, uncomplicated: Secondary | ICD-10-CM | POA: Insufficient documentation

## 2013-09-06 LAB — BASIC METABOLIC PANEL
BUN: 11 mg/dL (ref 6–23)
CO2: 26 mEq/L (ref 19–32)
Calcium: 9 mg/dL (ref 8.4–10.5)
Chloride: 97 mEq/L (ref 96–112)
Creatinine, Ser: 0.94 mg/dL (ref 0.50–1.35)
GFR calc Af Amer: >90 mL/min (ref >90)
GFR calc non Af Amer: >90 mL/min (ref >90)
Glucose, Bld: 96 mg/dL (ref 70–99)
Potassium: 3.7 mEq/L (ref 3.7–5.3)
Sodium: 135 mEq/L — ABNORMAL LOW (ref 137–147)

## 2013-09-06 LAB — URINALYSIS, ROUTINE W REFLEX MICROSCOPIC
BILIRUBIN URINE: NEGATIVE
Glucose, UA: NEGATIVE mg/dL
Hgb urine dipstick: NEGATIVE
Ketones, ur: NEGATIVE mg/dL
Leukocytes, UA: NEGATIVE
Nitrite: NEGATIVE
Protein, ur: NEGATIVE mg/dL
SPECIFIC GRAVITY, URINE: 1.017 (ref 1.005–1.030)
UROBILINOGEN UA: 0.2 mg/dL (ref 0.0–1.0)
pH: 7 (ref 5.0–8.0)

## 2013-09-06 LAB — CBC WITH DIFFERENTIAL/PLATELET
BASOS PCT: 0 % (ref 0–1)
Basophils Absolute: 0 10*3/uL (ref 0.0–0.1)
EOS ABS: 0.1 10*3/uL (ref 0.0–0.7)
EOS PCT: 1 % (ref 0–5)
HEMATOCRIT: 46.3 % (ref 39.0–52.0)
HEMOGLOBIN: 16.2 g/dL (ref 13.0–17.0)
LYMPHS ABS: 0.3 10*3/uL — AB (ref 0.7–4.0)
Lymphocytes Relative: 7 % — ABNORMAL LOW (ref 12–46)
MCH: 31.4 pg (ref 26.0–34.0)
MCHC: 35 g/dL (ref 30.0–36.0)
MCV: 89.7 fL (ref 78.0–100.0)
MONO ABS: 0.6 10*3/uL (ref 0.1–1.0)
MONOS PCT: 12 % (ref 3–12)
Neutro Abs: 4.2 10*3/uL (ref 1.7–7.7)
Neutrophils Relative %: 80 % — ABNORMAL HIGH (ref 43–77)
Platelets: 159 10*3/uL (ref 150–400)
RBC: 5.16 MIL/uL (ref 4.22–5.81)
RDW: 13 % (ref 11.5–15.5)
WBC: 5.2 10*3/uL (ref 4.0–10.5)

## 2013-09-06 MED ORDER — SODIUM CHLORIDE 0.9 % IV BOLUS (SEPSIS)
1000.0000 mL | Freq: Once | INTRAVENOUS | Status: AC
  Administered 2013-09-06: 1000 mL via INTRAVENOUS

## 2013-09-06 MED ORDER — IBUPROFEN 800 MG PO TABS
800.0000 mg | ORAL_TABLET | Freq: Once | ORAL | Status: AC
  Administered 2013-09-06: 800 mg via ORAL
  Filled 2013-09-06: qty 1

## 2013-09-06 MED ORDER — ALBUTEROL SULFATE HFA 108 (90 BASE) MCG/ACT IN AERS
4.0000 | INHALATION_SPRAY | Freq: Once | RESPIRATORY_TRACT | Status: AC
  Administered 2013-09-07: 4 via RESPIRATORY_TRACT
  Filled 2013-09-06: qty 6.7

## 2013-09-06 MED ORDER — OSELTAMIVIR PHOSPHATE 75 MG PO CAPS: 75.0000 mg | ORAL_CAPSULE | Freq: Two times a day (BID) | ORAL | Status: AC

## 2013-09-06 NOTE — ED Notes (Signed)
Pt arrived to the ED with a complaint of a fever.  Pt also has generalized weakness a headache, cough, shortness of breath.  Pt's symptoms started last night.  Pt states he has bone pain.

## 2013-09-06 NOTE — ED Provider Notes (Signed)
CSN: [id number]     Arrival date & time 09/06/13  2052 History   First MD Initiated Contact with Patient 09/06/13 2131     Chief Complaint  Patient presents with  . Fever   (Consider location/radiation/quality/duration/timing/severity/associated sxs/prior Treatment) Patient is a 23 y.o. male presenting with fever.  Fever Temp source:  Subjective Severity:  Severe Onset quality:  Sudden Duration:  8 hours Timing:  Constant Progression:  Worsening Chronicity:  New Relieved by:  Nothing Worsened by:  Nothing tried Associated symptoms: cough, headaches and myalgias   Associated symptoms: no chest pain, no congestion, no diarrhea, no nausea and no vomiting     History reviewed. No pertinent past medical history. Past Surgical History  Procedure Laterality Date  . Hand surgery     History reviewed. No pertinent family history. History  Substance Use Topics  . Smoking status: Current Some Day Smoker    Types: Pipe  . Smokeless tobacco: Not on file  . Alcohol Use: 0.5 oz/week    1 drink(s) per week    Review of Systems  Constitutional: Positive for fever.  HENT: Negative for congestion.   Respiratory: Positive for cough and shortness of breath.   Cardiovascular: Negative for chest pain.  Gastrointestinal: Negative for nausea, vomiting, abdominal pain and diarrhea.  Musculoskeletal: Positive for myalgias.  Neurological: Positive for headaches.  All other systems reviewed and are negative.    Allergies  Mushroom extract complex  Home Medications  No current outpatient prescriptions on file. BP 105/71  Pulse 117  Temp(Src) 102.5 F (39.2 C) (Oral)  Resp 20  Ht 5\' 10"  (1.778 m)  Wt 140 lb (63.504 kg)  BMI 20.09 kg/m2  SpO2 99% Physical Exam  Nursing note and vitals reviewed. Constitutional: He is oriented to person, place, and time. He appears well-developed and well-nourished. No distress.  HENT:  Head: Normocephalic and atraumatic.  Mouth/Throat: Oropharynx  is clear and moist.  Eyes: Conjunctivae are normal. Pupils are equal, round, and reactive to light. No scleral icterus.  Neck: Normal range of motion. Neck supple. No Brudzinski's sign and no Kernig's sign noted.  Cardiovascular: Regular rhythm, normal heart sounds and intact distal pulses.  Tachycardia present.   No murmur heard. Pulmonary/Chest: Effort normal and breath sounds normal. No stridor. No respiratory distress. He has no wheezes. He has no rales.  Abdominal: Soft. He exhibits no distension. There is no tenderness.  Musculoskeletal: Normal range of motion. He exhibits no edema.  Neurological: He is alert and oriented to person, place, and time.  Skin: Skin is warm and dry. No rash noted.  Psychiatric: He has a normal mood and affect. His behavior is normal.    ED Course  Procedures (including critical care time) Labs Review Labs Reviewed  CBC WITH DIFFERENTIAL - Abnormal; Notable for the following:    Neutrophils Relative % 80 (*)    Lymphocytes Relative 7 (*)    Lymphs Abs 0.3 (*)    All other components within normal limits  BASIC METABOLIC PANEL - Abnormal; Notable for the following:    Sodium 135 (*)    All other components within normal limits  URINALYSIS, ROUTINE W REFLEX MICROSCOPIC - Abnormal; Notable for the following:    APPearance CLOUDY (*)    All other components within normal limits   Imaging Review Dg Chest Port 1 View  09/06/2013   CLINICAL DATA:  Fever.  EXAM: PORTABLE CHEST - 1 VIEW  COMPARISON:  Two-view chest x-ray 04/14/2012.  FINDINGS: Cardiac  silhouette normal and mediastinal contours unremarkable for the AP portable technique. Pulmonary parenchyma clear. Bronchovascular markings normal. Pulmonary vascularity normal. No pneumothorax. No visible pleural effusions.  IMPRESSION: No acute cardiopulmonary disease.   Electronically Signed   By: Hulan Saashomas  Lawrence M.D.   On: 09/06/2013 22:03  All radiology studies independently viewed by me.     EKG  Interpretation   None       MDM   1. Influenza-like illness    23 yo male with no significant medical problems presenting with sudden onset fever, chills, myalgias, headache, cough.  Presentation concerning for influenza.  CXR and bloodwork negative.  He feels better after ibuprofen.  Plan outpatient treatment with tamiflu and supportive treatment.     Candyce ChurnJohn David Evanie Buckle, MD 09/06/13 [phone]

## 2013-09-06 NOTE — Discharge Instructions (Signed)

## 2014-10-20 ENCOUNTER — Emergency Department (HOSPITAL_COMMUNITY)
Admission: EM | Admit: 2014-10-20 | Discharge: 2014-10-20 | Disposition: A | Discharge status: Home / Self Care | Payer: PPO | Attending: Emergency Medicine | Admitting: Emergency Medicine

## 2014-10-20 ENCOUNTER — Emergency Department (HOSPITAL_COMMUNITY): Payer: PPO

## 2014-10-20 ENCOUNTER — Encounter (HOSPITAL_COMMUNITY): Payer: Self-pay

## 2014-10-20 DIAGNOSIS — R05 Cough: Secondary | ICD-10-CM | POA: Diagnosis present

## 2014-10-20 DIAGNOSIS — J0100 Acute maxillary sinusitis, unspecified: Secondary | ICD-10-CM | POA: Diagnosis not present

## 2014-10-20 DIAGNOSIS — M791 Myalgia: Secondary | ICD-10-CM | POA: Diagnosis not present

## 2014-10-20 DIAGNOSIS — Z720 Tobacco use: Secondary | ICD-10-CM | POA: Diagnosis not present

## 2014-10-20 DIAGNOSIS — R059 Cough, unspecified: Secondary | ICD-10-CM

## 2014-10-20 MED ORDER — AMOXICILLIN 500 MG PO CAPS: 500.0000 mg | ORAL_CAPSULE | Freq: Three times a day (TID) | ORAL | Status: AC

## 2014-10-20 NOTE — Discharge Instructions (Signed)
Take Amoxicillin as directed until gone. Refer to the resource guide to make an appointment with a primary care provider.    Emergency Department Resource Guide 1) Find a Doctor and Pay Out of Pocket Although you won't have to find out who is covered by your insurance plan, it is a good idea to ask around and get recommendations. You will then need to call the office and see if the doctor you have chosen will accept you as a new patient and what types of options they offer for patients who are self-pay. Some doctors offer discounts or will set up payment plans for their patients who do not have insurance, but you will need to ask so you aren't surprised when you get to your appointment.  2) Contact Your Local Health Department Not all health departments have doctors that can see patients for sick visits, but many do, so it is worth a call to see if yours does. If you don't know where your local health department is, you can check in your phone book. The CDC also has a tool to help you locate your state's health department, and many state websites also have listings of all of their local health departments.  3) Find a Walk-in Clinic If your illness is not likely to be very severe or complicated, you may want to try a walk in clinic. These are popping up all over the country in pharmacies, drugstores, and shopping centers. They're usually staffed by nurse practitioners or physician assistants that have been trained to treat common illnesses and complaints. They're usually fairly quick and inexpensive. However, if you have serious medical issues or chronic medical problems, these are probably not your best option.  No Primary Care Doctor: - Call Health Connect at  [phone] - they can help you locate a primary care doctor that  accepts your insurance, provides certain services, etc. - Physician Referral Service- [phone]  Chronic Pain Problems: Organization         Address  Phone   Notes  [facility]  [phone] Patients need to be referred by their primary care doctor.   Medication Assistance: Organization         Address  Phone   Notes  [facility] Medication [facility] [address] [place], [place] [address] [phone] --Must be a resident of [facility] [place] -- Must have NO insurance coverage whatsoever (no Medicaid/ Medicare, etc.) -- The pt. MUST have a primary care doctor that directs their care regularly and follows them in the community   MedAssist  [phone]   [employer]  [phone]    Agencies that provide inexpensive medical care: Organization         Address  Phone   Notes  [facility]  [phone]   [facility]    [phone]   [facility] [address] [place], [place] [address] [phone]) [phone]   [facility] [address], [place] [phone]   [facility]    [phone]   [facility]    [phone]   [facility]  [address], [place] Phone:  [phone], Fax:  [phone] Hours of Operation:  9 am - 6 pm, M-F.  Also accepts Medicaid/Medicare and self-pay.  [facility]  [address], [place] Phone: [phone], Fax: [phone]. Hours of Operation:  8:30 am - 5:30 pm, M-F.  Also accepts Medicaid and self-pay.  [facility] [place] [address], [place] Phone: [phone]   [place], [place], [place] [phone], Ext. [phone] Mondays & Thursdays: 7-9 AM.  First 15 patients are seen on a first come, first serve basis.    [place] Providers:  Organization         Address  Phone   Notes  [facility] [address], [place] [phone] Also accepts self-pay patients.  [facility] [address], [place]  [phone]   [place], [address], [place] [phone]   [facility] [address] [place] [phone]   Lucianne Lei [address], [place]   [phone] Only accepts [place] Access [place] patients after they have their name applied to their card.   Self-Pay (no insurance) in [facility]:  Organization         Address  Phone   Notes  Sickle Cell Patients, [facility] [place] [phone]   [facility] [place] [phone]   [facility] [place]  [place], [place], [place] [phone]   [facility]/Dr. Osei-Bonsu  [address], [place] or [address], [place] [phone] Phone number for both [place] and [place] locations is the same.  [facility]gent [facility] and [facility] [address], [place] [phone]   [facility] [address] [place] or [address] [phone]   [facility] [address] [place] [phone], phone; [phone], fax Sees patients 1st and 3rd Saturday of every month.  Must not qualify for public or private insurance (i.e. Medicaid, Medicare, [place] Health Choice, Veterans' Benefits)  Household income should be no more than 200% of the poverty level The clinic cannot treat you if you are pregnant or think you are pregnant  Sexually transmitted diseases are not treated at the clinic.    Dental Care: Organization         Address  Phone  Notes  [facility] of [facility] [place] [phone] Accepts children up to age 37 who are enrolled in [place] or [place]; pregnant women with a Medicaid card; and children who have applied for  Medicaid or [place] Health Choice, but were declined, whose parents can pay a reduced fee at time of service.  [facility] Department of [facility]  [address], [place] [phone] Accepts children up to age 26 who are enrolled in [place] or [place]; pregnant women with a Medicaid card; and children who have applied for Medicaid or [place] Health Choice, but were declined, whose parents can pay a reduced fee at time of service.  [facility]  [place] [phone] Patients are seen by appointment only. Walk-ins are not accepted. [place] will see patients 43 years of age and older. Monday - Tuesday (8am-5pm) Most Wednesdays (8:30-5pm) $30 per visit, cash only  [place] [facility] [employer] PROGRAM  [address], [employer] [phone])  [phone] Patients are seen by appointment only. Walk-ins are not accepted. [place] will see patients 34 years of age and older. One Wednesday Evening (Monthly: Volunteer Based).  $30 per visit, cash only  [place]  [phone] for adults; Children under age 57, call Graduate Pediatric Dentistry at [phone]. Children aged 16-14, please call [phone] to request a pediatric application.  Dental services are provided in all areas of dental care including fillings, crowns and bridges, complete and partial dentures, implants, gum treatment, root canals, and extractions. Preventive care is also provided. Treatment is provided to both adults and children. Patients are selected via a lottery and there is often a waiting list.   [facility] [address] [place]  [phone] [email or web address]   [facility] [address] [place], [place] [phone], Ext. [phone] Second and Fourth Thursday of each month, opens at 6:30 AM; Clinic ends at 9 AM.  Patients are seen on a first-come first-served basis, and a limited number  are seen during each clinic.   [facility]  [address] Hillard Danker [place], [place] [phone]   Eligibility Requirements You must have lived in [place], [place], or [place] counties for at least the last three months.   You cannot be eligible for state or federal sponsored [employer], including [employer], [place], or [employer].   You generally cannot be eligible for healthcare insurance through your employer.    How to apply: Eligibility screenings are held every Tuesday and Wednesday afternoon from 1:00 pm until 4:00 pm. You do not need an appointment for the interview!  [facility] [address] [place], [place]   [place]  [place] Department  [place]  [phone]    Behavioral Health Resources in the Community: Intensive Outpatient Programs Organization         Address  Phone  Notes  [facility]. [address], [place], [place] [phone]   [facility] [address] [place], [place]   ADS: Alcohol & Drug Svcs [address], [place], [place]   [place] [address]  [place], [place] or [phone]   Substance Abuse Resources Organization         Address  Phone  Notes  Alcohol and Drug Services  [phone]   [place]  [phone]   [facility] [place]   Chinita Pester  [phone]   Residential & Outpatient Substance Abuse Program  [phone]   Psychological Services Organization         Address  Phone  Notes  [facility] [place]  [place]  [phone]   [place] [address] [place] or [phone]    Mobile Crisis Teams Organization         Address  Phone  Notes  Therapeutic  Alternatives, Mobile Crisis Care Unit  [phone]   Assertive Psychotherapeutic Services  [address] [place], [place]   [place] Levels [address] [place] [phone]    Self-Help/Support Groups Organization         Address  Phone             Notes  [place]. of [place] - variety of support groups  [phone]  [phone] Call for more information  Narcotics Anonymous (NA), [facility] [address], [employer] [place]  2 meetings at this location   Residential Facilities manager         Address  Phone  Notes  [facility] [place],    [place]  [phone]   [facility]  [address], [place] [address], [place], [place]   [place], [place] [phone] Admissions: 8am-3pm M-F  Incentives Substance [place] [address]    [place], [place] [phone]   [facility] [address] [place], [place], [place]   [facility] [address]  [place], [place]   [facility]ight Programs - Intensive Outpatient [address], [place], [place]   [facility] ([place] [address].,  [place], [place] [phone] or [phone]   [facility] ([facility]S) [address], [place], [place] Accepts Medicaid  [facility] [place] [address]  [place] [phone] Substance Abuse/Addiction Treatment   [facility] Organization         Address  Phone  Notes  [facility]  [phone]   Domenic Schwab, PhD [address] Arlis Porta [place], [place]   [phone] or [phone]   [place], [place] [phone]   [facility] [address] [place], [place] [phone] Insurance/Medicaid/sponsorship through [facility] and Families  [address] [place]                                    [place], [place] [phone] [place] [address] [place], [place] [phone]    Dr. Adele Schilder  [phone]   [facility] Dept. 1) [address], [place] 2) [place] 3)  [place] [address], [place] [phone]  [phone]   [place] [phone] or [phone] (After Hours)

## 2014-10-20 NOTE — ED Provider Notes (Signed)
CSN: [id number]     Arrival date & time 10/20/14  2127 History  This chart was scribed for non-physician practitioner, Ryan Beck, PA-C working with Ryan Mocha, MD, by Ryan Mcclain, ED Scribe. This patient was seen in room WTR8/WTR8 and the patient's care was started at 10:17 PM.    Chief Complaint  Patient presents with  . Cough     (Consider location/radiation/quality/duration/timing/severity/associated sxs/prior Treatment) Patient is a 24 y.o. male presenting with cough. The history is provided by the patient. No language interpreter was used.  Cough Associated symptoms: myalgias, shortness of breath and sore throat     HPI Comments: Ryan Mcclain is a 24 y.o. male who presents to the Emergency Department complaining of a sore throat onset three days ago with associated symptoms of myalgia, congestion, itchy eyes and a headache.  He currently also feels like he has shortness of breath.  Patient has taken tylenol and antibiotics and states that they have not alleviated his symptoms.  Patient has been out of breath in the past and was given an inhaler, also he states that he has more difficulty breathing when he exerts himself. Patient does not have a PCP.    History reviewed. No pertinent past medical history. Past Surgical History  Procedure Laterality Date  . Hand surgery     History reviewed. No pertinent family history. History  Substance Use Topics  . Smoking status: Current Some Day Smoker    Types: Pipe  . Smokeless tobacco: Not on file  . Alcohol Use: 0.5 oz/week    1 drink(s) per week    Review of Systems  HENT: Positive for congestion and sore throat.   Eyes: Positive for itching.  Respiratory: Positive for cough and shortness of breath.   Musculoskeletal: Positive for myalgias.  All other systems reviewed and are negative.     Allergies  Mushroom extract complex  Home Medications   Prior to Admission medications   Medication Sig Start Date End  Date Taking? Authorizing Provider  oseltamivir (TAMIFLU) 75 MG capsule Take 1 capsule (75 mg total) by mouth every 12 (twelve) hours. 09/06/13   Ryan Churn III, MD   BP 112/65 mmHg  Pulse 70  Temp(Src) 98.1 F (36.7 C) (Oral)  Resp 18  SpO2 100% Physical Exam  Constitutional: He is oriented to person, place, and time. He appears well-developed and well-nourished. No distress.  HENT:  Head: Normocephalic and atraumatic.  Mouth/Throat: No oropharyngeal exudate.  Maxillary sinus tenderness to palpation.   Eyes: Conjunctivae and EOM are normal.  Neck: Neck supple. No tracheal deviation present.  Cardiovascular: Normal rate.   Pulmonary/Chest: Effort normal. No respiratory distress. He has no wheezes.  Abdominal: Soft. He exhibits no distension. There is no tenderness.  Musculoskeletal: Normal range of motion.  Neurological: He is alert and oriented to person, place, and time.  Skin: Skin is warm and dry.  Psychiatric: He has a normal mood and affect. His behavior is normal.  Nursing note and vitals reviewed.   ED Course  Procedures (including critical care time) DIAGNOSTIC STUDIES: Oxygen Saturation is 100% on RA, normal by my interpretation.    COORDINATION OF CARE: 10:22 PM Discussed treatment plan with patient at beside, the patient agrees with the plan and has no further questions at this time.   Labs Review Labs Reviewed - No data to display  Imaging Review No results found.   EKG Interpretation None      MDM   Final diagnoses:  Cough  Acute maxillary sinusitis, recurrence not specified   10:56 PM Patient's chest xray unremarkable for acute changes. Vitals stable and patient afebrile. Patient will be discharged with amoxicillin for sinusitis.   I personally performed the services described in this documentation, which was scribed in my presence. The recorded information has been reviewed and is accurate.    Ryan BeckKaitlyn Ian Castagna, PA-C 10/20/14  2257  Ryan MochaBlair Walden, MD 10/20/14 [phone]

## 2014-10-20 NOTE — ED Notes (Signed)
Patient reports nasal congestion, cough, sore throat, and body aches x 3 days.  No distress noted.

## 2014-10-21 ENCOUNTER — Encounter (HOSPITAL_COMMUNITY): Payer: Self-pay

## 2014-10-21 ENCOUNTER — Emergency Department (HOSPITAL_COMMUNITY)
Admission: EM | Admit: 2014-10-21 | Discharge: 2014-10-21 | Disposition: A | Discharge status: Home / Self Care | Payer: PPO | Attending: Emergency Medicine | Admitting: Emergency Medicine

## 2014-10-21 DIAGNOSIS — Z720 Tobacco use: Secondary | ICD-10-CM | POA: Diagnosis not present

## 2014-10-21 DIAGNOSIS — R55 Syncope and collapse: Secondary | ICD-10-CM | POA: Diagnosis present

## 2014-10-21 DIAGNOSIS — Z79899 Other long term (current) drug therapy: Secondary | ICD-10-CM | POA: Diagnosis not present

## 2014-10-21 DIAGNOSIS — B349 Viral infection, unspecified: Secondary | ICD-10-CM | POA: Diagnosis not present

## 2014-10-21 DIAGNOSIS — E860 Dehydration: Secondary | ICD-10-CM | POA: Diagnosis not present

## 2014-10-21 LAB — CBC WITH DIFFERENTIAL/PLATELET
Basophils Absolute: 0 10*3/uL (ref 0.0–0.1)
Basophils Relative: 0 % (ref 0–1)
Eosinophils Absolute: 0.1 10*3/uL (ref 0.0–0.7)
Eosinophils Relative: 1 % (ref 0–5)
HEMATOCRIT: 46.1 % (ref 39.0–52.0)
Hemoglobin: 15.9 g/dL (ref 13.0–17.0)
LYMPHS ABS: 0.7 10*3/uL (ref 0.7–4.0)
LYMPHS PCT: 16 % (ref 12–46)
MCH: 31.1 pg (ref 26.0–34.0)
MCHC: 34.5 g/dL (ref 30.0–36.0)
MCV: 90 fL (ref 78.0–100.0)
MONOS PCT: 20 % — AB (ref 3–12)
Monocytes Absolute: 0.8 10*3/uL (ref 0.1–1.0)
Neutro Abs: 2.7 10*3/uL (ref 1.7–7.7)
Neutrophils Relative %: 64 % (ref 43–77)
Platelets: 182 10*3/uL (ref 150–400)
RBC: 5.12 MIL/uL (ref 4.22–5.81)
RDW: 12.9 % (ref 11.5–15.5)
WBC: 4.2 10*3/uL (ref 4.0–10.5)

## 2014-10-21 LAB — BASIC METABOLIC PANEL
ANION GAP: 6 (ref 5–15)
BUN: 12 mg/dL (ref 6–23)
CALCIUM: 8.9 mg/dL (ref 8.4–10.5)
CO2: 23 mmol/L (ref 19–32)
Chloride: 108 mmol/L (ref 96–112)
Creatinine, Ser: 0.81 mg/dL (ref 0.50–1.35)
GFR calc non Af Amer: >90 mL/min (ref >90)
Glucose, Bld: 100 mg/dL — ABNORMAL HIGH (ref 70–99)
Potassium: 3.8 mmol/L (ref 3.5–5.1)
SODIUM: 137 mmol/L (ref 135–145)

## 2014-10-21 MED ORDER — SODIUM CHLORIDE 0.9 % IV BOLUS (SEPSIS)
1000.0000 mL | Freq: Once | INTRAVENOUS | Status: AC
  Administered 2014-10-21: 1000 mL via INTRAVENOUS

## 2014-10-21 MED ORDER — METOCLOPRAMIDE HCL 5 MG/ML IJ SOLN
10.0000 mg | Freq: Once | INTRAMUSCULAR | Status: AC
  Administered 2014-10-21: 10 mg via INTRAVENOUS
  Filled 2014-10-21: qty 2

## 2014-10-21 MED ORDER — DIPHENHYDRAMINE HCL 50 MG/ML IJ SOLN
25.0000 mg | Freq: Once | INTRAMUSCULAR | Status: AC
  Administered 2014-10-21: 25 mg via INTRAVENOUS
  Filled 2014-10-21: qty 1

## 2014-10-21 MED ORDER — KETOROLAC TROMETHAMINE 30 MG/ML IJ SOLN
30.0000 mg | Freq: Once | INTRAMUSCULAR | Status: AC
  Administered 2014-10-21: 30 mg via INTRAVENOUS
  Filled 2014-10-21: qty 1

## 2014-10-21 NOTE — ED Notes (Signed)
Pt states he feels a little better but still has a headache, pt states he is just tired.

## 2014-10-21 NOTE — Discharge Instructions (Signed)
Drink plenty of fluids to maintain hydration. Refer to attached documents for more information. Return to the ED with worsening or concerning.

## 2014-10-21 NOTE — ED Notes (Signed)
Patient's friends report they were bringing him back to ER (he was discharged earlier tonight) because he still didn't feel well, when he had a syncopal episode.  Patient extremely weak and shaking.

## 2014-10-21 NOTE — ED Provider Notes (Signed)
CSN: [id number]     Arrival date & time 10/21/14  0134 History   First MD Initiated Contact with Patient 10/21/14 [phone]     Chief Complaint  Patient presents with  . Loss of Consciousness     (Consider location/radiation/quality/duration/timing/severity/associated sxs/prior Treatment) HPI Comments: Patient is a 24 year old healthy male with no past medical history who was seen earlier today and diagnosed with sinusitis. Patient returns to the ED after a syncopal episode that occurred prior to arrival. Patient reports leaving the ED and having 4 episodes of diarrhea. When he got up to go to the bathroom, he passed out. His 2 friends brought him to the ED.   Patient is a 24 y.o. male presenting with syncope. The history is provided by the patient. No language interpreter was used.  Loss of Consciousness Episode history:  Single Most recent episode:  Today Duration:  10 seconds Timing:  Rare Progression:  Resolved Chronicity:  New Context: dehydration and standing up   Context: not blood draw, not bowel movement, not exertion, not inactivity, not medication change, not with normal activity, not sight of blood and not sitting down   Witnessed: no   Relieved by:  Sitting up and lying down Worsened by:  Nothing tried Ineffective treatments:  None tried Associated symptoms: weakness   Associated symptoms: no chest pain, no dizziness, no fever, no nausea, no palpitations, no shortness of breath and no vomiting   Risk factors: no congenital heart disease, no coronary artery disease, no seizures and no vascular disease     History reviewed. No pertinent past medical history. Past Surgical History  Procedure Laterality Date  . Hand surgery     No family history on file. History  Substance Use Topics  . Smoking status: Current Some Day Smoker    Types: Pipe  . Smokeless tobacco: Not on file  . Alcohol Use: 0.5 oz/week    1 drink(s) per week    Review of Systems  Constitutional:  Negative for fever, chills and fatigue.  HENT: Negative for trouble swallowing.   Eyes: Negative for visual disturbance.  Respiratory: Negative for shortness of breath.   Cardiovascular: Positive for syncope. Negative for chest pain and palpitations.  Gastrointestinal: Negative for nausea, vomiting, abdominal pain and diarrhea.  Genitourinary: Negative for dysuria and difficulty urinating.  Musculoskeletal: Negative for arthralgias and neck pain.  Skin: Negative for color change.  Neurological: Positive for syncope and weakness. Negative for dizziness.  Psychiatric/Behavioral: Negative for dysphoric mood.      Allergies  Mushroom extract complex  Home Medications   Prior to Admission medications   Medication Sig Start Date End Date Taking? Authorizing Provider  acetaminophen (TYLENOL) 500 MG tablet Take 500 mg by mouth every 6 (six) hours as needed for moderate pain or fever.   Yes Historical Provider, MD  Amoxicillin-Pot Clavulanate (AUGMENTIN PO) Take 1 tablet by mouth once.   Yes Historical Provider, MD  Ascorbic Acid (VITAMIN C) 1000 MG tablet Take 1,000 mg by mouth daily.   Yes Historical Provider, MD  amoxicillin (AMOXIL) 500 MG capsule Take 1 capsule (500 mg total) by mouth 3 (three) times daily. 10/20/14   Emilia BeckKaitlyn Malyah Ohlrich, PA-C  oseltamivir (TAMIFLU) 75 MG capsule Take 1 capsule (75 mg total) by mouth every 12 (twelve) hours. Patient not taking: Reported on 10/20/2014 09/06/13   Candyce ChurnJohn David Wofford III, MD   BP 111/95 mmHg  Pulse 107  Temp(Src) 102.3 F (39.1 C) (Oral)  Resp 18  SpO2  100% Physical Exam  Constitutional: He is oriented to person, place, and time. He appears well-developed and well-nourished. No distress.  HENT:  Head: Normocephalic and atraumatic.  Eyes: Conjunctivae and EOM are normal.  Neck: Normal range of motion.  Cardiovascular: Normal rate and regular rhythm.  Exam reveals no gallop and no friction rub.   No murmur heard. Pulmonary/Chest: Effort  normal and breath sounds normal. He has no wheezes. He has no rales. He exhibits no tenderness.  Abdominal: Soft. He exhibits no distension. There is no tenderness. There is no rebound.  Musculoskeletal: Normal range of motion.  Neurological: He is alert and oriented to person, place, and time. No cranial nerve deficit. Coordination normal.  Speech is goal-oriented. Moves limbs without ataxia.   Skin: Skin is warm and dry.  Psychiatric: He has a normal mood and affect. His behavior is normal.  Nursing note and vitals reviewed.   ED Course  Procedures (including critical care time) Labs Review Labs Reviewed  CBC WITH DIFFERENTIAL/PLATELET - Abnormal; Notable for the following:    Monocytes Relative 20 (*)    All other components within normal limits  BASIC METABOLIC PANEL - Abnormal; Notable for the following:    Glucose, Bld 100 (*)    All other components within normal limits    Imaging Review Dg Chest 2 View  10/20/2014   CLINICAL DATA:  Cough and sore throat for 3 days. Shortness of breath this morning.  EXAM: CHEST  2 VIEW  COMPARISON:  09/06/2013  FINDINGS: Normal heart size and pulmonary vascularity. Mild hyperinflation. No focal airspace disease or consolidation in the lungs. No blunting of costophrenic angles. No pneumothorax. Mediastinal contours appear intact.  IMPRESSION: No active cardiopulmonary disease.   Electronically Signed   By: Burman Nieves M.D.   On: 10/20/2014 22:42     EKG Interpretation   Date/Time:  Thursday October 21 2014 01:47:19 EST Ventricular Rate:  99 PR Interval:  170 QRS Duration: 83 QT Interval:  310 QTC Calculation: 398 R Axis:   68 Text Interpretation:  Sinus rhythm Probable anteroseptal infarct, old NO  ACUTE CHANGES Confirmed by Rhunette Croft, MD, Janey Genta [phone]) on 10/21/2014  2:09:03 AM      MDM   Final diagnoses:  Viral illness  Dehydration  Syncope and collapse   2:30 AM Patient is hypovolemic based on orthostatic vitals signs.  Patient will have 1L bolus of saline. Patient is febrile at 102.3 and will have toradol for fever. Patient tachycardic. Remaining vitals stable.   6:20 AM Patient feeling better and will be discharged with instructions to drink plenty of fluids. Patient likely has a viral illness. Patient instructed to return with worsening or concerning symptoms.   Emilia Beck, [place] 10/21/14 [phone]

## 2014-12-19 IMAGING — CR DG CHEST 1V PORT
1 series · 1 of 1 positions shown · non-contrast
Comparison: Two-view chest x-ray 04/14/2012.

CLINICAL DATA: Fever.

EXAM:
PORTABLE CHEST - 1 VIEW

[AP]
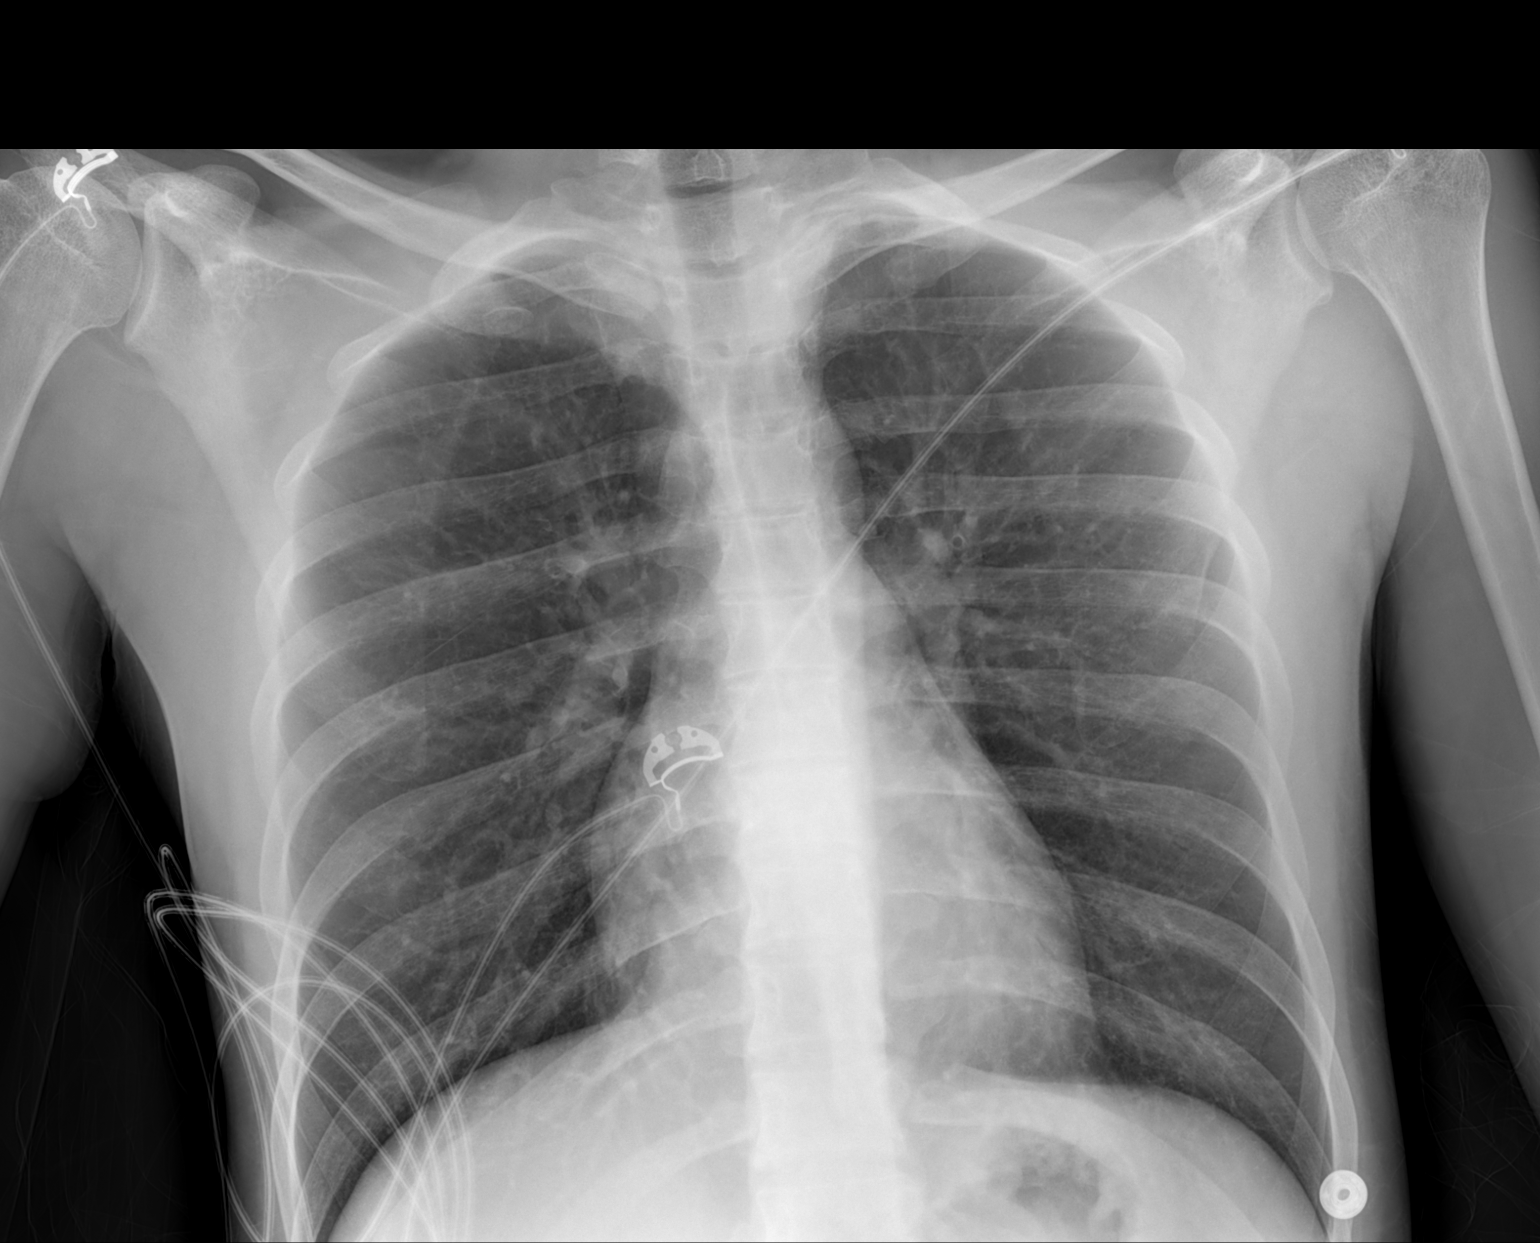

[1 of 1 positions shown; findings below may reference images not displayed]

FINDINGS: Cardiac silhouette normal and mediastinal contours unremarkable for
the AP portable technique. Pulmonary parenchyma clear.
Bronchovascular markings normal. Pulmonary vascularity normal. No
pneumothorax. No visible pleural effusions.
IMPRESSION: No acute cardiopulmonary disease.

## 2016-02-01 IMAGING — CR DG CHEST 2V
2 series · 2 of 2 positions shown · non-contrast
Comparison: 09/06/2013

CLINICAL DATA: Cough and sore throat for 3 days. Shortness of
breath this morning.

EXAM:
CHEST  2 VIEW

[w chest pa]
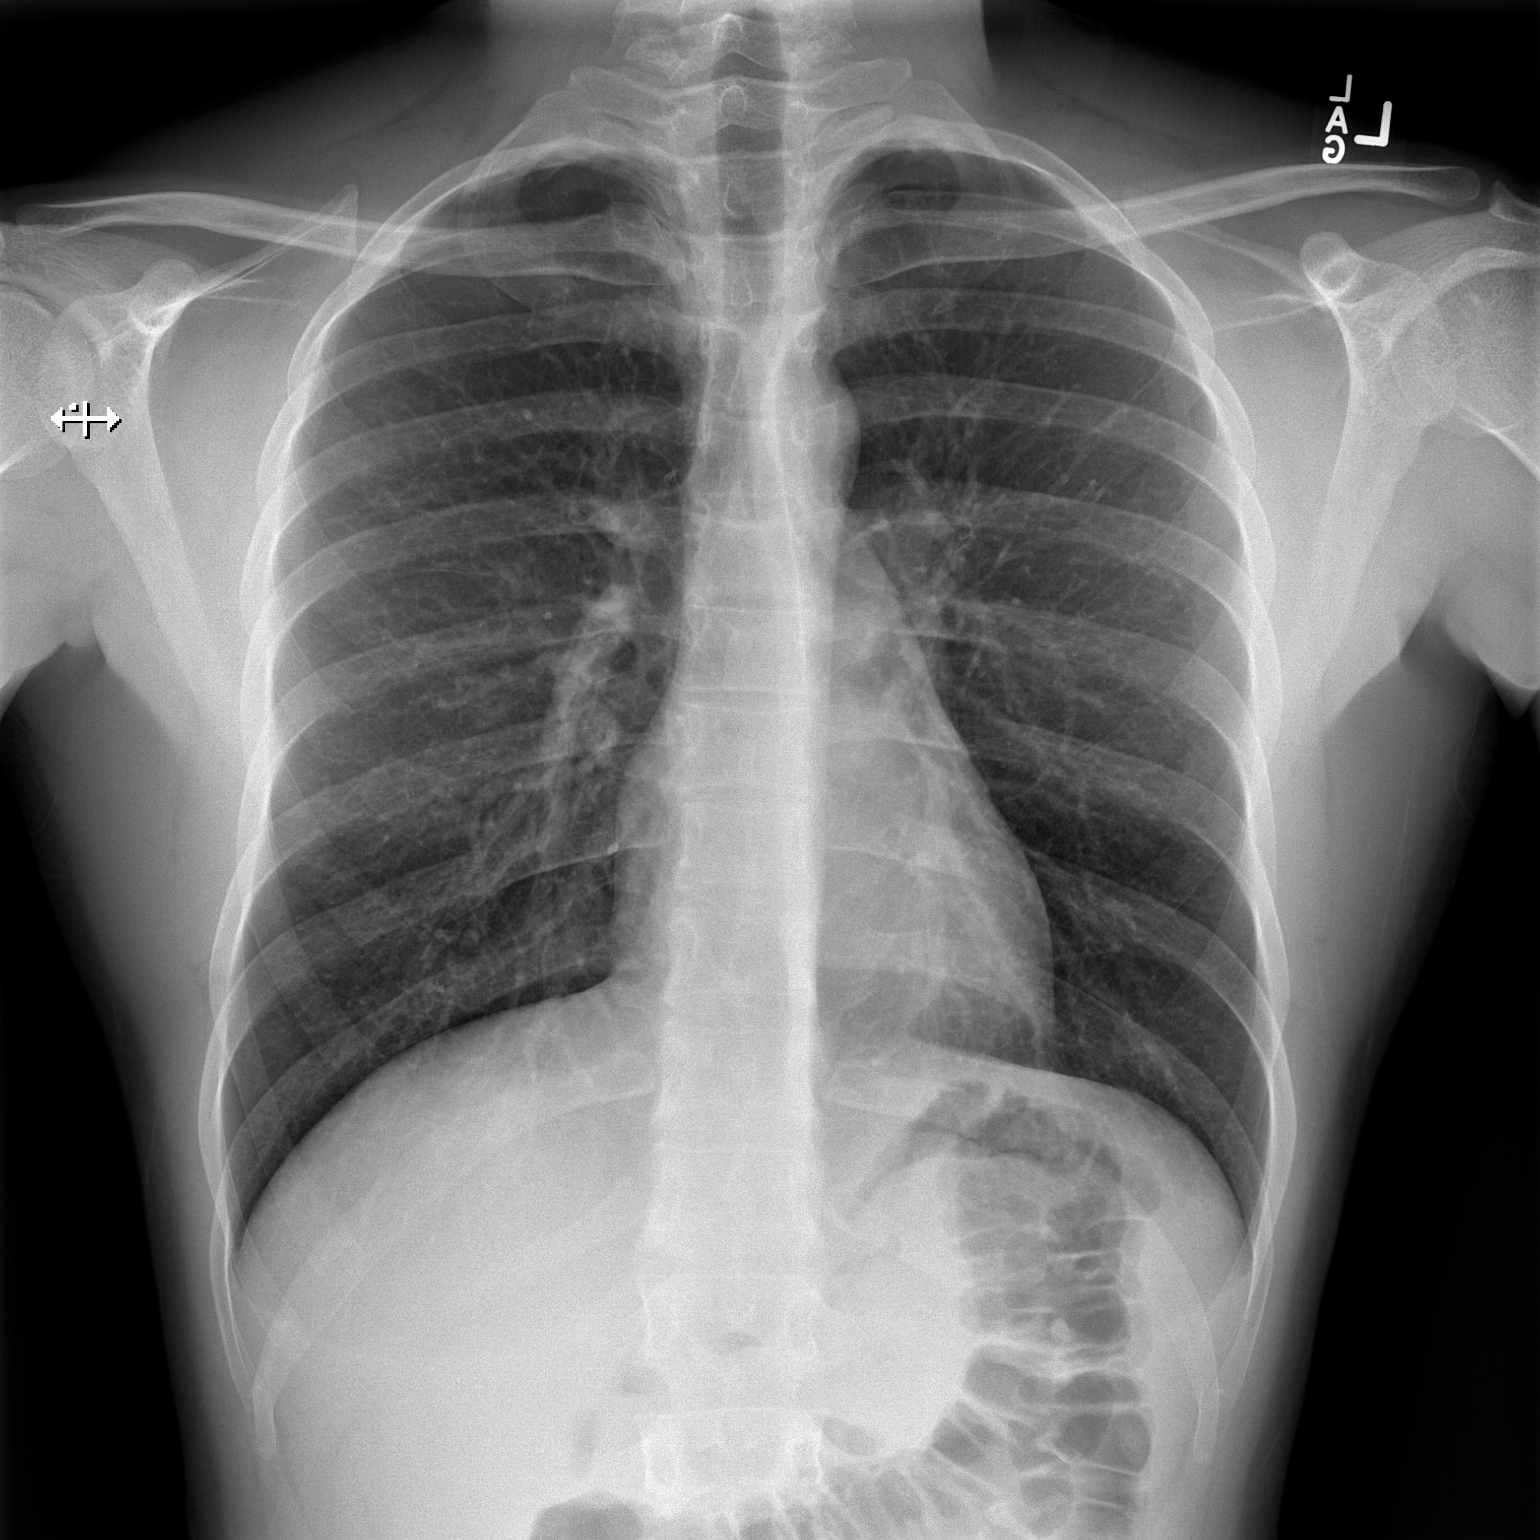

[w chest lat]
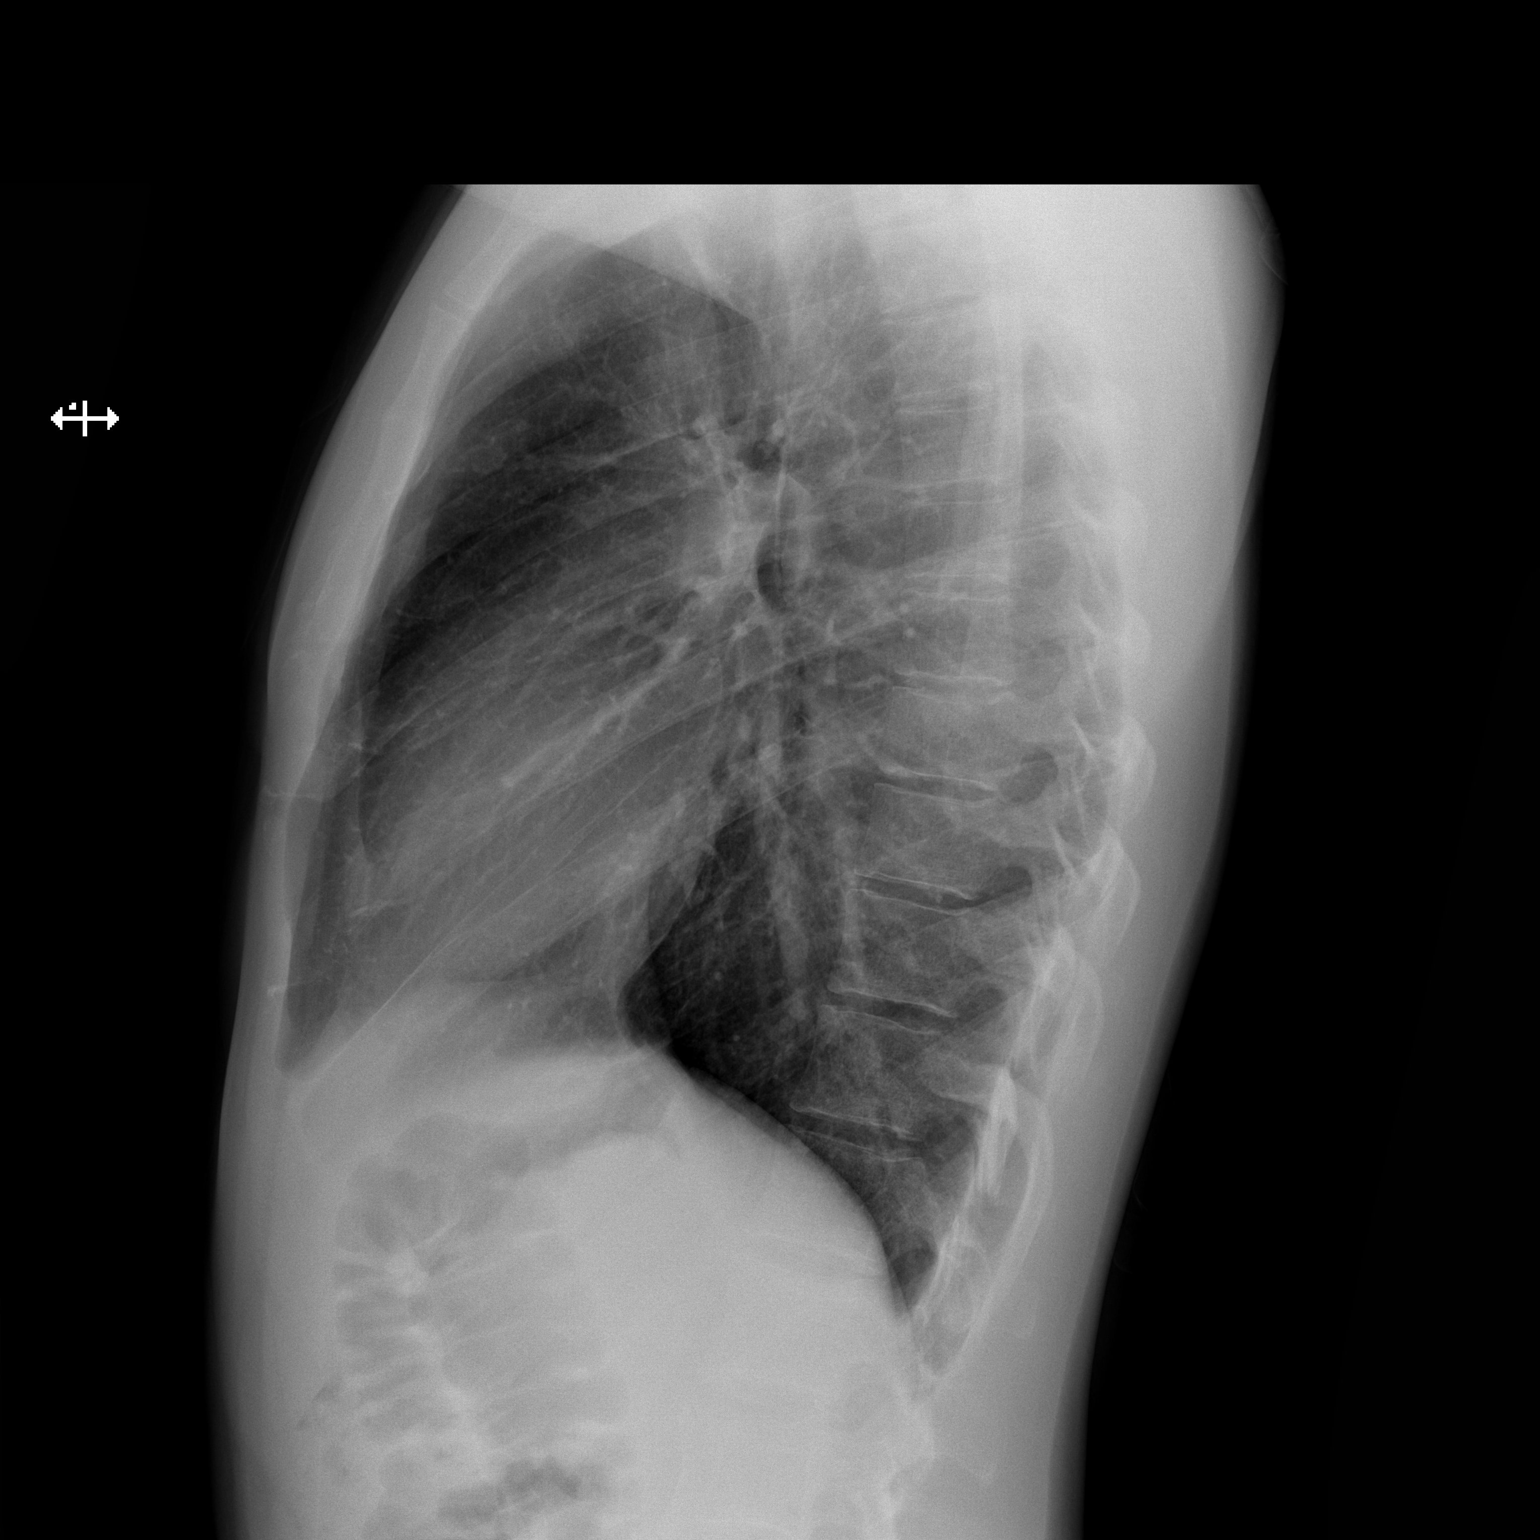

[2 of 2 positions shown; findings below may reference images not displayed]

FINDINGS: Normal heart size and pulmonary vascularity. Mild hyperinflation. No
focal airspace disease or consolidation in the lungs. No blunting of
costophrenic angles. No pneumothorax. Mediastinal contours appear
intact.
IMPRESSION: No active cardiopulmonary disease.
